# Patient Record
Sex: Male | Born: 2000 | Race: White | Hispanic: No | Marital: Single | Smoking: Never smoker
Health system: Southern US, Community
[De-identification: ages and names within clinical notes are randomized; demographics above are authoritative.]

---
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
## 2022-07-18 DIAGNOSIS — B354 Tinea corporis: Secondary | ICD-10-CM | POA: Diagnosis not present

## 2022-07-18 DIAGNOSIS — S99922A Unspecified injury of left foot, initial encounter: Secondary | ICD-10-CM | POA: Diagnosis not present

## 2022-07-18 DIAGNOSIS — Z23 Encounter for immunization: Secondary | ICD-10-CM | POA: Diagnosis not present

## 2022-07-18 DIAGNOSIS — R052 Subacute cough: Secondary | ICD-10-CM | POA: Diagnosis not present

## 2023-06-22 DIAGNOSIS — H1820 Unspecified corneal edema: Secondary | ICD-10-CM | POA: Diagnosis not present

## 2023-06-22 DIAGNOSIS — H5213 Myopia, bilateral: Secondary | ICD-10-CM | POA: Diagnosis not present
